# Patient Record
Sex: Male | Born: 1995 | Race: White | Hispanic: No | Marital: Single
Health system: Southern US, Community
[De-identification: ages and names within clinical notes are randomized; demographics above are authoritative.]

---
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
## 2017-03-05 ENCOUNTER — Other Ambulatory Visit: Payer: Self-pay | Admitting: Unknown Physician Specialty

## 2017-03-05 ENCOUNTER — Ambulatory Visit
Admission: RE | Admit: 2017-03-05 | Discharge: 2017-03-05 | Disposition: A | Discharge status: Home / Self Care | Payer: PRIVATE HEALTH INSURANCE | Source: Ambulatory Visit | Attending: Unknown Physician Specialty | Admitting: Unknown Physician Specialty

## 2017-03-05 DIAGNOSIS — R05 Cough: Secondary | ICD-10-CM | POA: Insufficient documentation

## 2017-03-05 DIAGNOSIS — R059 Cough, unspecified: Secondary | ICD-10-CM

## 2018-10-29 IMAGING — CR DG CHEST 2V
1 series · 2 of 2 positions shown · non-contrast
Comparison: None.

CLINICAL DATA: Cough and chest congestion.

EXAM:
CHEST  2 VIEW

[Series 1: dg chest 2 view · 0.14mm/px · 2 of 2 slices shown]
[im 1/2]
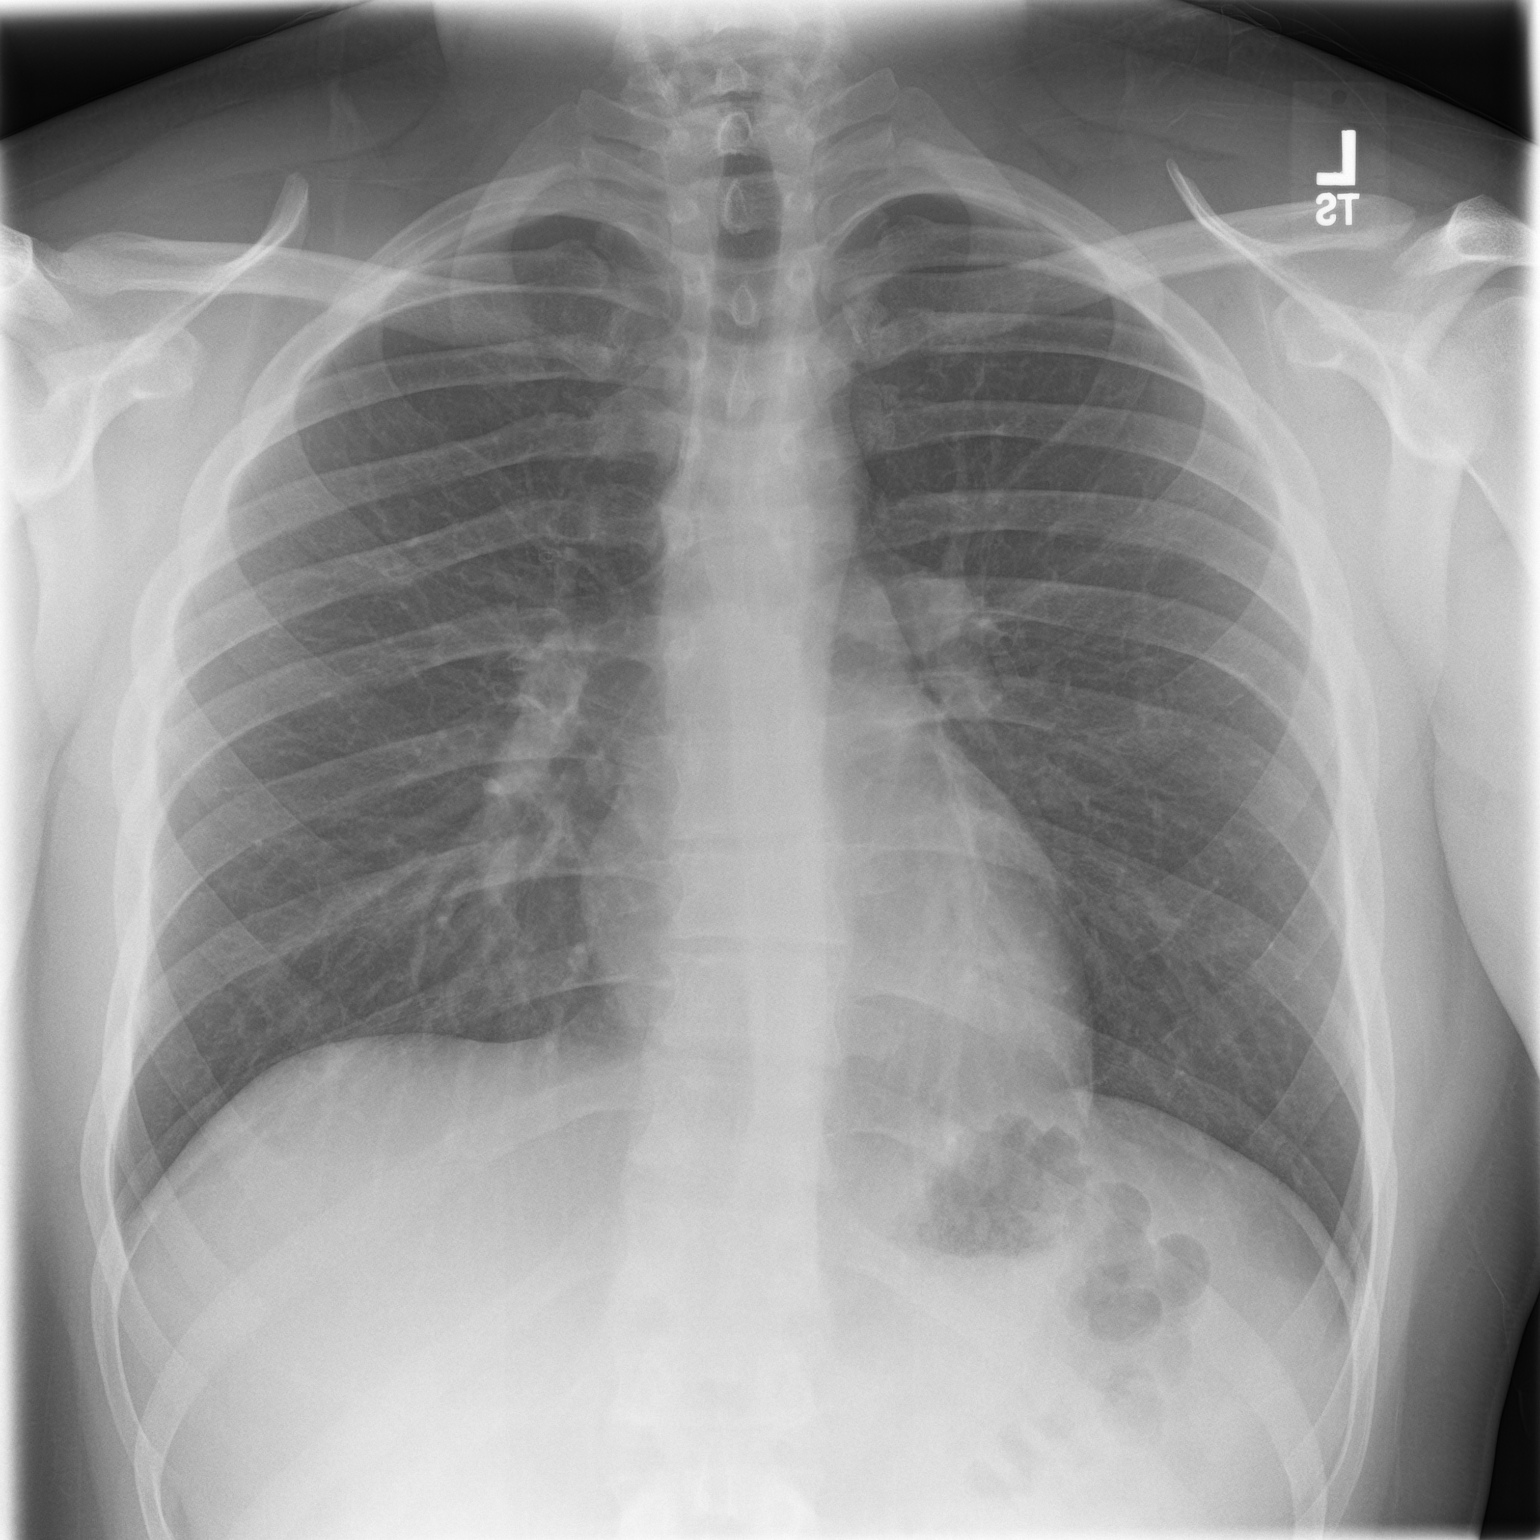
[im 2/2]
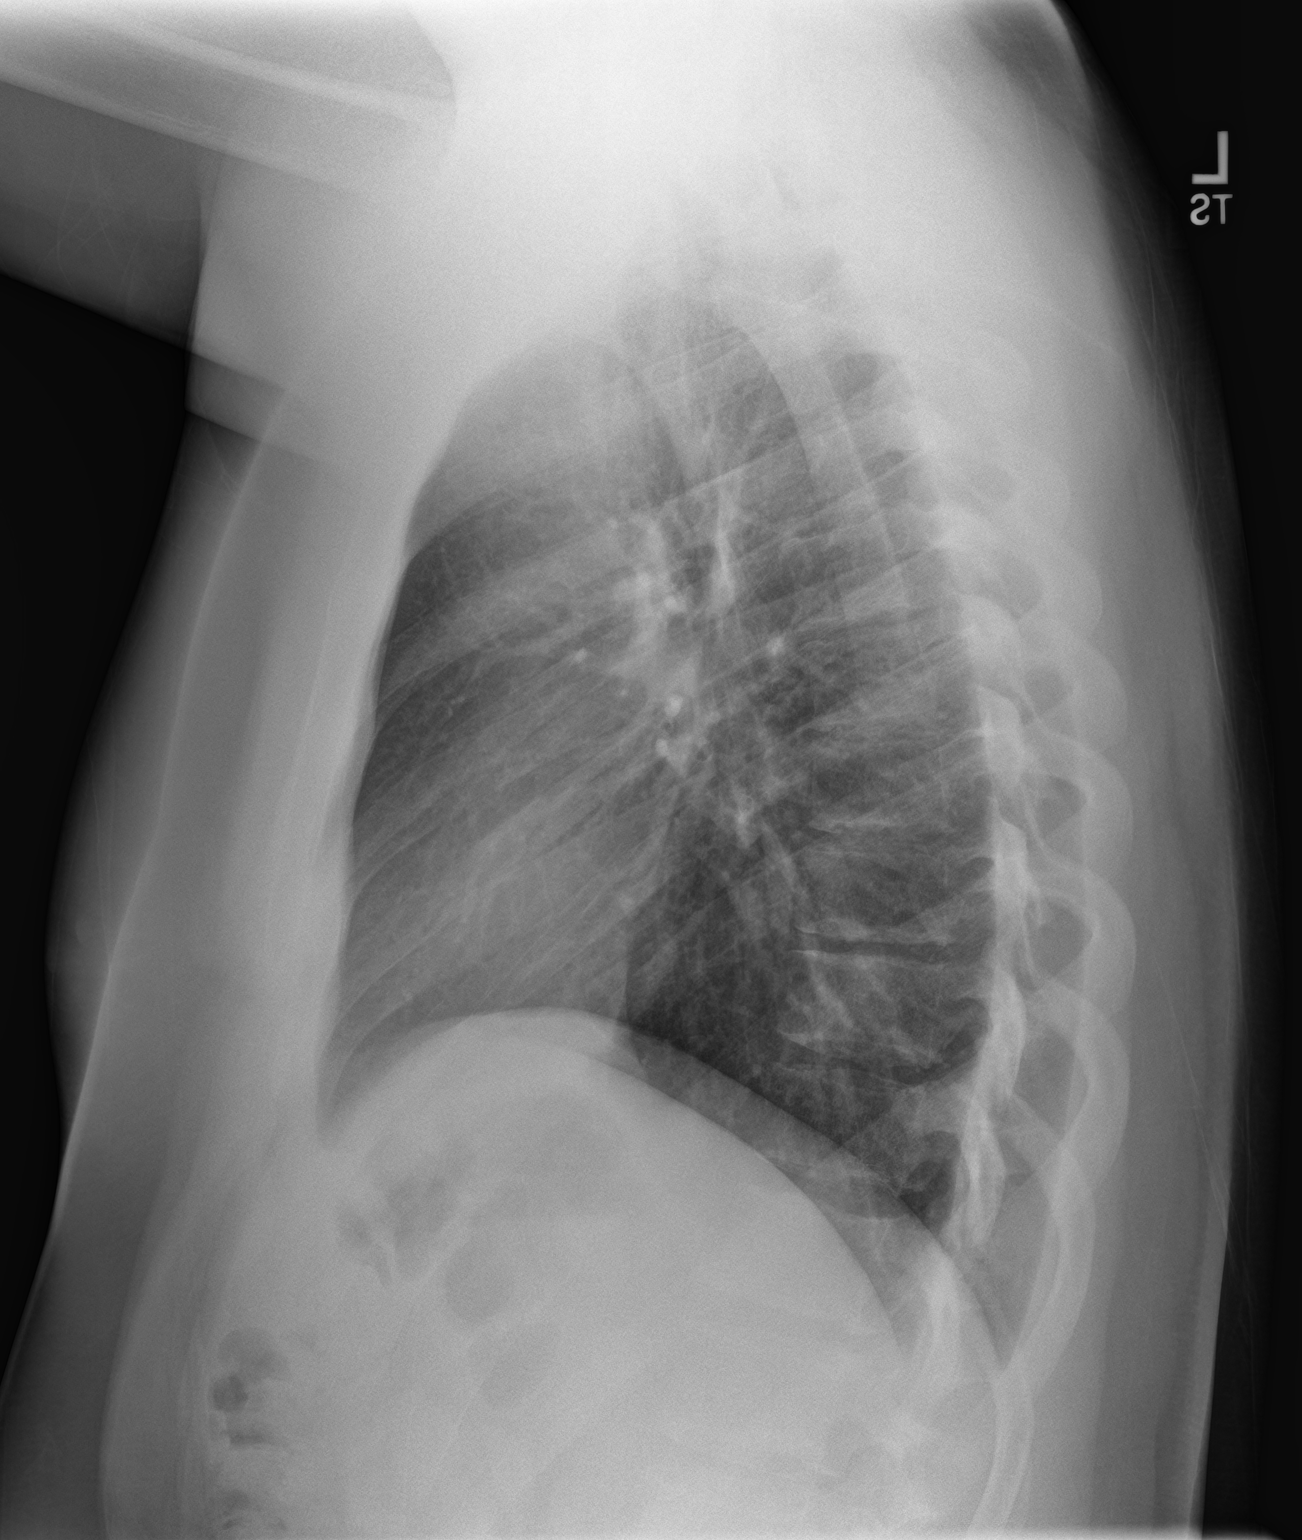

[2 of 2 positions shown; findings below may reference images not displayed]

FINDINGS: The heart size and mediastinal contours are within normal limits.
Both lungs are clear. The visualized skeletal structures are
unremarkable.
IMPRESSION: Normal exam.

## 2019-09-06 ENCOUNTER — Ambulatory Visit: Payer: Self-pay | Attending: Internal Medicine

## 2019-09-06 DIAGNOSIS — Z23 Encounter for immunization: Secondary | ICD-10-CM

## 2019-09-06 NOTE — Progress Notes (Signed)
   Covid-19 Vaccination Clinic  Name:  Kin Galbraith    MRN: [id number] DOB: [birth year]  09/06/2019  Mr. Lazalde was observed post Covid-19 immunization for 15 minutes without incident. He was provided with Vaccine Information Sheet and instruction to access the V-Safe system.   Mr. Futch was instructed to call 911 with any severe reactions post vaccine: Marland Kitchen Difficulty breathing  . Swelling of face and throat  . A fast heartbeat  . A bad rash all over body  . Dizziness and weakness   Immunizations Administered    Name Date Dose VIS Date Route   Pfizer COVID-19 Vaccine 09/06/2019 10:11 AM 0.3 mL 05/30/2019 Intramuscular   Manufacturer: [employer], [employer]   Lot: [id number]   NDC: [address]-5

## 2019-09-30 ENCOUNTER — Ambulatory Visit: Payer: PRIVATE HEALTH INSURANCE | Attending: Internal Medicine

## 2019-09-30 DIAGNOSIS — Z23 Encounter for immunization: Secondary | ICD-10-CM

## 2019-09-30 NOTE — Progress Notes (Signed)
   Covid-19 Vaccination Clinic  Name:  Jayvier Burgher    MRN: [id number] DOB: 1996/03/31  09/30/2019  Mr. Hetz was observed post Covid-19 immunization for 15 minutes without incident. He was provided with Vaccine Information Sheet and instruction to access the V-Safe system.   Mr. Musich was instructed to call 911 with any severe reactions post vaccine: Marland Kitchen Difficulty breathing  . Swelling of face and throat  . A fast heartbeat  . A bad rash all over body  . Dizziness and weakness   Immunizations Administered    Name Date Dose VIS Date Route   Pfizer COVID-19 Vaccine 09/30/2019  2:34 PM 0.3 mL 05/30/2019 Intramuscular   Manufacturer: [employer], [employer]   Lot: [id number]   NDC: [address]-7

## 2023-03-16 ENCOUNTER — Other Ambulatory Visit: Payer: Self-pay | Admitting: Family Medicine

## 2023-03-16 DIAGNOSIS — R748 Abnormal levels of other serum enzymes: Secondary | ICD-10-CM

## 2023-03-19 ENCOUNTER — Ambulatory Visit
Admission: RE | Admit: 2023-03-19 | Discharge: 2023-03-19 | Disposition: A | Discharge status: Home / Self Care | Payer: BC Managed Care – PPO | Source: Ambulatory Visit | Attending: Family Medicine | Admitting: Family Medicine

## 2023-03-19 DIAGNOSIS — R748 Abnormal levels of other serum enzymes: Secondary | ICD-10-CM | POA: Diagnosis present

## 2024-01-14 ENCOUNTER — Encounter: Payer: Self-pay | Admitting: Dermatology

## 2024-01-14 ENCOUNTER — Ambulatory Visit: Admitting: Dermatology

## 2024-01-14 DIAGNOSIS — Z7189 Other specified counseling: Secondary | ICD-10-CM

## 2024-01-14 DIAGNOSIS — L719 Rosacea, unspecified: Secondary | ICD-10-CM | POA: Diagnosis not present

## 2024-01-14 NOTE — Patient Instructions (Addendum)
 Counseling for BBL / IPL / Laser and Coordination of Care Discussed the treatment option of Broad Band Light (BBL) /Intense Pulsed Light (IPL)/ Laser for skin discoloration, including brown spots and redness.  Typically we recommend at least 1-3 treatment sessions about 5-8 weeks apart for best results.  Cannot have tanned skin when BBL performed, and regular use of sunscreen/photoprotection is advised after the procedure to help maintain results. The patient's condition may also require "maintenance treatments" in the future.  The fee for BBL / laser treatments is $350 per treatment session for the whole face.  A fee can be quoted for other parts of the body.  Insurance typically does not pay for BBL/laser treatments and therefore the fee is an out-of-pocket cost. Recommend prophylactic valtrex treatment. Once scheduled for procedure, will send Rx in prior to patient's appointment.     Due to recent changes in healthcare laws, you may see results of your pathology and/or laboratory studies on MyChart before the doctors have had a chance to review them. We understand that in some cases there may be results that are confusing or concerning to you. Please understand that not all results are received at the same time and often the doctors may need to interpret multiple results in order to provide you with the best plan of care or course of treatment. Therefore, we ask that you please give [place] 2 business days to thoroughly review all your results before contacting the office for clarification. Should we see a critical lab result, you will be contacted sooner.   If You Need Anything After Your Visit  If you have any questions or concerns for your doctor, please call our main line at [phone] and press option 4 to reach your doctor's medical assistant. If no one answers, please leave a voicemail as directed and we will return your call as soon as possible. Messages left after 4 pm will be answered the  following business day.   You may also send [place] a message via MyChart. We typically respond to MyChart messages within 1-2 business days.  For prescription refills, please ask your pharmacy to contact our office. Our fax number is [phone].  If you have an urgent issue when the clinic is closed that cannot wait until the next business day, you can page your doctor at the number below.    Please note that while we do our best to be available for urgent issues outside of office hours, we are not available 24/7.   If you have an urgent issue and are unable to reach [place], you may choose to seek medical care at your doctor's office, retail clinic, urgent care center, or emergency room.  If you have a medical emergency, please immediately call 911 or go to the emergency department.  Pager Numbers  - Dr. Gwen Pounds: [phone]  - Dr. Roseanne Reno: [phone]  - Dr. Katrinka Blazing: [phone]   In the event of inclement weather, please call our main line at [phone] for an update on the status of any delays or closures.  Dermatology Medication Tips: Please keep the boxes that topical medications come in in order to help keep track of the instructions about where and how to use these. Pharmacies typically print the medication instructions only on the boxes and not directly on the medication tubes.   If your medication is too expensive, please contact our office at [phone] option 4 or send [place] a message through MyChart.   We are unable to tell what  your co-pay for medications will be in advance as this is different depending on your insurance coverage. However, we may be able to find a substitute medication at lower cost or fill out paperwork to get insurance to cover a needed medication.   If a prior authorization is required to get your medication covered by your insurance company, please allow [place] 1-2 business days to complete this process.  Drug prices often vary depending on where the  prescription is filled and some pharmacies may offer cheaper prices.  The website www.goodrx.com contains coupons for medications through different pharmacies. The prices here do not account for what the cost may be with help from insurance (it may be cheaper with your insurance), but the website can give you the price if you did not use any insurance.  - You can print the associated coupon and take it with your prescription to the pharmacy.  - You may also stop by our office during regular business hours and pick up a GoodRx coupon card.  - If you need your prescription sent electronically to a different pharmacy, notify our office through [facility] or by phone at [phone] option 4.     Si Usted Necesita Algo Despus de Su Visita  Tambin puede enviarnos un mensaje a travs de Clinical cytogeneticist. Por lo general respondemos a los mensajes de MyChart en el transcurso de 1 a 2 das hbiles.  Para renovar recetas, por favor pida a su farmacia que se ponga en contacto con nuestra oficina. Annie Sable de fax es [place] [phone].  Si tiene un asunto urgente cuando la clnica est cerrada y que no puede esperar hasta el siguiente da hbil, puede llamar/localizar a su doctor(a) al nmero que aparece a continuacin.   Por favor, tenga en cuenta que aunque hacemos todo lo posible para estar disponibles para asuntos urgentes fuera del horario de [place], no estamos disponibles las 24 horas del da, los 7 [address]  [address] de la [place].   Si tiene un problema urgente y no puede comunicarse con nosotros, puede optar por buscar atencin mdica  en el consultorio de su doctor(a), en una clnica privada, en un centro de atencin urgente o en una sala de emergencias.  Si tiene Engineer, drilling, por favor llame inmediatamente al 911 o vaya a la sala de emergencias.  Nmeros de bper  - Dr. Gwen Pounds: [phone]  - Dra. Roseanne Reno: [phone]  - Dr. Katrinka Blazing: [phone]   En caso de inclemencias del  tiempo, por favor llame a Lacy Duverney principal al [phone] para una actualizacin sobre el [place] de cualquier retraso o cierre.  Consejos para la medicacin en dermatologa: Por favor, guarde las cajas en las que vienen los medicamentos de uso tpico para ayudarle a seguir las instrucciones sobre dnde y cmo usarlos. Las farmacias generalmente imprimen las instrucciones del medicamento slo en las cajas y no directamente en los tubos del [place].   Si su medicamento es muy caro, por favor, pngase en contacto con Rolm Gala llamando al [phone] y presione la opcin 4 o envenos un mensaje a travs de Clinical cytogeneticist.   No podemos decirle cul ser su copago por los medicamentos por adelantado ya que esto es diferente dependiendo de la cobertura de su seguro. Sin embargo, es posible que podamos encontrar un medicamento sustituto a Audiological scientist un formulario para que el seguro cubra el medicamento que se considera necesario.   Si se requiere una autorizacin previa para que su compaa de seguros [place] su  medicamento, por favor permtanos de 1 a 2 das hbiles para completar [address]  Los precios de los medicamentos varan con frecuencia dependiendo del Environmental consultant de dnde se surte la receta y alguna farmacias pueden ofrecer precios ms baratos.  El sitio web www.goodrx.com tiene cupones para medicamentos de Health and safety inspector. Los precios aqu no tienen en cuenta lo que podra costar con la ayuda del seguro (puede ser ms barato con su seguro), pero el sitio web puede darle el precio si no utiliz Tourist information centre manager.  - Puede imprimir el cupn correspondiente y llevarlo con su receta a la farmacia.  - Tambin puede pasar por nuestra oficina durante el horario de atencin regular y Education officer, museum una tarjeta de cupones de GoodRx.  - Si necesita que su receta se enve electrnicamente a una farmacia diferente, informe a nuestra oficina a travs de MyChart de [place] o por telfono  llamando al [phone] y presione la opcin 4.

## 2024-01-14 NOTE — Progress Notes (Signed)
   New Patient Visit   Subjective  Ronnie Ayala is a 28 y.o. male who presents for the following: Concerned for nose turning red x1 year, more red in the summer, but also stays red in the winter. Patient denies and pain or itch. Applies sunscreen, but other than that no other topicals. Has not correlated any triggers.    The following portions of the chart were reviewed this encounter and updated as appropriate: medications, allergies, medical history  Review of Systems:  No other skin or systemic complaints except as noted in HPI or Assessment and Plan.  Objective  Well appearing patient in no apparent distress; mood and affect are within normal limits.  A focused examination was performed of the following areas: Face  Relevant exam findings are noted in the Assessment and Plan.    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 Condition is bothersome/symptomatic for patient. Currently flared.   Rosacea is a chronic progressive skin condition usually affecting the face of adults, causing redness and/or acne bumps. It is treatable but not curable. It sometimes affects the eyes (ocular rosacea) as well. It may respond to topical and/or systemic medication and can flare with stress, sun exposure, alcohol, exercise, topical steroids (including hydrocortisone/cortisone 10) and some foods.  Daily application of broad spectrum spf 30+ sunscreen to face is recommended to reduce flares.  Treatment Plan Counseling for BBL / IPL / Laser and Coordination of Care Discussed the treatment option of Broad Band Light (BBL) /Intense Pulsed Light (IPL)/ Laser for skin discoloration, including brown spots and redness.  Typically we recommend at least 1-3 treatment sessions about 5-8 weeks apart for best results.  Cannot have tanned skin when BBL performed, and regular use  of sunscreen/photoprotection is advised after the procedure to help maintain results. The patient's condition may also require maintenance treatments in the future.  The fee for BBL / laser treatments is $350 per treatment session for the whole face.  A fee can be quoted for other parts of the body.  Insurance typically does not pay for BBL/laser treatments and therefore the fee is an out-of-pocket cost. Recommend prophylactic valtrex treatment. Once scheduled for procedure, will send Rx in prior to patient's appointment.  SPF 30 sunscreen and wide brim hat every day, minimize sun exposure, avoid triggers if possible Creams may only reduce redness temporarily Recommend at least three treatments. Will send MyChart message with out of pocket cost for just treating the nose.    ROSACEA    Return for PRN.  I, Jacquelynn V. Wilfred, CMA, am acting as scribe for Boneta Sharps, MD .   Documentation: I have reviewed the above documentation for accuracy and completeness, and I agree with the above.  Boneta Sharps, MD

## 2024-05-27 ENCOUNTER — Telehealth: Payer: Self-pay

## 2024-05-27 NOTE — Telephone Encounter (Signed)
 Patient mom in the office today for a check up, she brought in photos of patient's face/nose for review   Photos enclosed in media

## 2024-06-09 ENCOUNTER — Ambulatory Visit (INDEPENDENT_AMBULATORY_CARE_PROVIDER_SITE_OTHER): Payer: PRIVATE HEALTH INSURANCE | Admitting: Dermatology

## 2024-06-09 DIAGNOSIS — L719 Rosacea, unspecified: Secondary | ICD-10-CM

## 2024-06-09 DIAGNOSIS — I781 Nevus, non-neoplastic: Secondary | ICD-10-CM

## 2024-06-09 NOTE — Patient Instructions (Addendum)
 Rosacea  What is rosacea? Rosacea (say: ro-zay-sha) is a common skin disease that usually begins as a trend of flushing or blushing easily.  As rosacea progresses, a persistent redness in the center of the face will develop and may gradually spread beyond the nose and cheeks to the forehead and chin.  In some cases, the ears, chest, and back could be affected.  Rosacea may appear as tiny blood vessels or small red bumps that occur in crops.  Frequently they can contain pus, and are called "pustules".  If the bumps do not contain pus, they are referred to as "papules".  Rarely, in prolonged, untreated cases of rosacea, the oil glands of the nose and cheeks may become permanently enlarged.  This is called rhinophyma, and is seen more frequently in men.  Signs and Risks In its beginning stages, rosacea tends to come and go, which makes it difficult to recognize.  It can start as intermittent flushing of the face.  Eventually, blood vessels may become permanently visible.  Pustules and papules can appear, but can be mistaken for adult acne.  People of all races, ages, genders and ethnic groups are at risk of developing rosacea.  However, it is more common in women (especially around menopause) and adults with fair skin between the ages of 26 and 90.  Treatment Dermatologists typically recommend a combination of treatments to effectively manage rosacea.  Treatment can improve symptoms and may stop the progression of the rosacea.  Treatment may involve both topical and oral medications.  The tetracycline antibiotics are often used for their anti-inflammatory effect; however, because of the possibility of developing antibiotic resistance, they should not be used long term at full dose.  For dilated blood vessels the options include electrodessication (uses electric current through a small needle), laser treatment, and cosmetics to hide the redness.   With all forms of treatment, improvement is a slow process, and  patients may not see any results for the first 3-4 weeks.  It is very important to avoid the sun and other triggers.  Patients must wear sunscreen daily.  Skin Care Instructions: Cleanse the skin with a mild soap such as CeraVe cleanser, Cetaphil cleanser, or Dove soap once or twice daily as needed. Moisturize with Eucerin Redness Relief Daily Perfecting Lotion (has a subtle green tint), CeraVe Moisturizing Cream, or Oil of Olay Daily Moisturizer with sunscreen every morning and/or night as recommended. Makeup should be "non-comedogenic" (won't clog pores) and be labeled "for sensitive skin". Good choices for cosmetics are: Neutrogena, Almay, and Physician's Formula.  Any product with a green tint tends to offset a red complexion. If your eyes are dry and irritated, use artificial tears 2-3 times per day and cleanse the eyelids daily with baby shampoo.  Have your eyes examined at least every 2 years.  Be sure to tell your eye doctor that you have rosacea. Alcoholic beverages tend to cause flushing of the skin, and may make rosacea worse. Always wear sunscreen, protect your skin from extreme hot and cold temperatures, and avoid spicy foods, hot drinks, and mechanical irritation such as rubbing, scrubbing, or massaging the face.  Avoid harsh skin cleansers, cleansing masks, astringents, and exfoliation. If a particular product burns or makes your face feel tight, then it is likely to flare your rosacea. If you are having difficulty finding a sunscreen that you can tolerate, you may try switching to a chemical-free sunscreen.  These are ones whose active ingredient is zinc oxide or titanium dioxide  only.  They should also be fragrance free, non-comedogenic, and labeled for sensitive skin. Rosacea triggers may vary from person to person.  There are a variety of foods that have been reported to trigger rosacea.  Some patients find that keeping a diary of what they were doing when they flared helps them avoid  triggers.   Due to recent changes in healthcare laws, you may see results of your pathology and/or laboratory studies on MyChart before the doctors have had a chance to review them. We understand that in some cases there may be results that are confusing or concerning to you. Please understand that not all results are received at the same time and often the doctors may need to interpret multiple results in order to provide you with the best plan of care or course of treatment. Therefore, we ask that you please give us  2 business days to thoroughly review all your results before contacting the office for clarification. Should we see a critical lab result, you will be contacted sooner.   If You Need Anything After Your Visit  If you have any questions or concerns for your doctor, please call our main line at [phone] and press option 4 to reach your doctor's medical assistant. If no one answers, please leave a voicemail as directed and we will return your call as soon as possible. Messages left after 4 pm will be answered the following business day.   You may also send us  a message via MyChart. We typically respond to MyChart messages within 1-2 business days.  For prescription refills, please ask your pharmacy to contact our office. Our fax number is [phone].  If you have an urgent issue when the clinic is closed that cannot wait until the next business day, you can page your doctor at the number below.    Please note that while we do our best to be available for urgent issues outside of office hours, we are not available 24/7.   If you have an urgent issue and are unable to reach us , you may choose to seek medical care at your doctor's office, retail clinic, urgent care center, or emergency room.  If you have a medical emergency, please immediately call 911 or go to the emergency department.  Pager Numbers  - Dr. Hester: [phone]  - Dr. Jackquline: [phone]  - Dr. Claudene:  [phone]   - Dr. Raymund: [phone]  In the event of inclement weather, please call our main line at [phone] for an update on the status of any delays or closures.  Dermatology Medication Tips: Please keep the boxes that topical medications come in in order to help keep track of the instructions about where and how to use these. Pharmacies typically print the medication instructions only on the boxes and not directly on the medication tubes.   If your medication is too expensive, please contact our office at [phone] option 4 or send us  a message through MyChart.   We are unable to tell what your co-pay for medications will be in advance as this is different depending on your insurance coverage. However, we may be able to find a substitute medication at lower cost or fill out paperwork to get insurance to cover a needed medication.   If a prior authorization is required to get your medication covered by your insurance company, please allow us  1-2 business days to complete this process.  Drug prices often vary depending on where the prescription is filled and some pharmacies  may offer cheaper prices.  The website www.goodrx.com contains coupons for medications through different pharmacies. The prices here do not account for what the cost may be with help from insurance (it may be cheaper with your insurance), but the website can give you the price if you did not use any insurance.  - You can print the associated coupon and take it with your prescription to the pharmacy.  - You may also stop by our office during regular business hours and pick up a GoodRx coupon card.  - If you need your prescription sent electronically to a different pharmacy, notify our office through [facility] or by phone at [phone] option 4.     Si Usted Necesita Algo Despus de Su Visita  Tambin puede enviarnos un mensaje a travs de Clinical Cytogeneticist. Por lo general respondemos a los mensajes de  MyChart en el transcurso de 1 a 2 das hbiles.  Para renovar recetas, por favor pida a su farmacia que se ponga en contacto con nuestra oficina. Randi lakes de fax es [place] [phone].  Si tiene un asunto urgente cuando la clnica est cerrada y que no puede esperar hasta el siguiente da hbil, puede llamar/localizar a su doctor(a) al nmero que aparece a continuacin.   Por favor, tenga en cuenta que aunque hacemos todo lo posible para estar disponibles para asuntos urgentes fuera del horario de [place], no estamos disponibles las 24 horas del da, los 7 [address]  [address] de la [place].   Si tiene un problema urgente y no puede comunicarse con nosotros, puede optar por buscar atencin mdica  en el consultorio de su doctor(a), en una clnica privada, en un centro de atencin urgente o en una sala de emergencias.  Si tiene engineer, drilling, por favor llame inmediatamente al 911 o vaya a la sala de emergencias.  Nmeros de bper  - Dr. Hester: [phone]  - Dra. Jackquline: [phone]  - Dr. Claudene: [phone]  - Dra. Kitts: [phone]  En caso de inclemencias del [place], por favor llame a nuestra lnea principal al [phone] para una actualizacin sobre el estado de cualquier retraso o cierre.  Consejos para la medicacin en dermatologa: Por favor, guarde las cajas en las que vienen los medicamentos de uso tpico para ayudarle a seguir las instrucciones sobre dnde y cmo usarlos. Las farmacias generalmente imprimen las instrucciones del medicamento slo en las cajas y no directamente en los tubos del [place].   Si su medicamento es muy caro, por favor, pngase en contacto con landry rieger llamando al [phone] y presione la opcin 4 o envenos un mensaje a travs de Clinical Cytogeneticist.   No podemos decirle cul ser su copago por los medicamentos por adelantado ya que esto es diferente dependiendo de la cobertura de su seguro. Sin embargo, es posible que podamos encontrar un  medicamento sustituto a audiological scientist un formulario para que el seguro cubra el medicamento que se considera necesario.   Si se requiere una autorizacin previa para que su compaa de seguros cubra su medicamento, por favor permtanos de 1 a 2 das hbiles para completar este proceso.  Los precios de los medicamentos varan con frecuencia dependiendo del environmental consultant de dnde se surte la receta y alguna farmacias pueden ofrecer precios ms baratos.  El sitio web www.goodrx.com tiene cupones para medicamentos de health and safety inspector. Los precios aqu no tienen en cuenta lo que podra costar con la ayuda del seguro (puede ser ms barato con su seguro), pero el sitio web puede  darle el precio si no utiliz [employer].  - Puede imprimir el cupn correspondiente y llevarlo con su receta a la farmacia.  - Tambin puede pasar por nuestra oficina durante el horario de atencin regular y education officer, museum una tarjeta de cupones de GoodRx.  - Si necesita que su receta se enve electrnicamente a una farmacia diferente, informe a nuestra oficina a travs de MyChart de [place] o por telfono llamando al [phone] y presione la opcin 4.

## 2024-06-09 NOTE — Progress Notes (Unsigned)
" ° °  Follow-Up Visit   Subjective  Ronnie Ayala is a 28 y.o. male who presents for the following: here for BBL treatment for redness at nose   1st treatment   The following portions of the chart were reviewed this encounter and updated as appropriate: medications, allergies, medical history  Review of Systems:  No other skin or systemic complaints except as noted in HPI or Assessment and Plan.  Objective  Well appearing patient in no apparent distress; mood and affect are within normal limits.  A focused examination was performed of the following areas: nose  Relevant exam findings are noted in the Assessment and Plan.             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 Condition is symptomatic/ bothersome to patient. Not currently at goal. Rosacea is a chronic progressive skin condition usually affecting the face of adults, causing redness and/or acne bumps. It is treatable but not curable. It sometimes affects the eyes (ocular rosacea) as well. It may respond to topical and/or systemic medication and can flare with stress, sun exposure, alcohol, exercise, topical steroids (including hydrocortisone/cortisone 10) and some foods.  Daily application of broad spectrum spf 30+ sunscreen to face is recommended to reduce flares.  Patient denies grittiness of the eyes  Treatment Plan BBL treatment today  Treatment 1 Discussed may take at least 3 treatments  Laser safety: Patient was advised in laser safety.  Patient was fitted with laser safety goggles and advised to keep eyes closed during procedure with goggles on. Staff and provider ensured that patient and their own safety goggles were also on and eyes protected during procedure. Laser room door was secured and locked from the inside. Laser room door has laser safety sign affixed to the outside of the door.    Patient given BBL  Starter Kit   [place] BBL - 06/09/24 1700      Patient Details   Skin Type: I    Photo Takes: Yes    Consent Signed: Yes      Treatment Details   Date: 06/09/24    Treatment #: 1    Area: face   vascular - redness at nose   Filter: 1st Pass      1st Pass   Location: F    Device: 560   erythema at nose   BBL j/cm2: 26    PW Msec Sec: 27    Target Temp: 20    Pulses: 62    7mm: this one     Patient tolerated the procedure well.   [place] avoidance was stressed. The patient will call with any problems, questions or concerns prior to their next appointment. TELANGIECTASIA   ROSACEA    Follow up in 2 + mos for 2nd BBL laser tx for Rosacea of nose  I, Ronnie Ayala, CMA, am acting as scribe for [name], MD.   Documentation: I have reviewed the above documentation for accuracy and completeness, and I agree with the above.  [name], MD    "

## 2024-06-10 ENCOUNTER — Encounter: Payer: Self-pay | Admitting: Dermatology

## 2024-06-10 ENCOUNTER — Telehealth: Payer: Self-pay

## 2024-06-10 NOTE — Telephone Encounter (Signed)
 Tried calling patient to see how he is doing after recent BBL done on his nose. No answer and unable to leave voice mail due to mail box being full.   Will try to call again at another time.

## 2024-09-08 ENCOUNTER — Ambulatory Visit: Payer: PRIVATE HEALTH INSURANCE | Admitting: Dermatology
